# Patient Record
Sex: Male | Born: 2000 | Race: White | Hispanic: No | Marital: Single | Smoking: Never smoker
Health system: Southern US, Community
[De-identification: ages and names within clinical notes are randomized; demographics above are authoritative.]

## PROBLEM LIST (undated history)

## (undated) ENCOUNTER — Ambulatory Visit: Admission: EM | Payer: 59

## (undated) ENCOUNTER — Ambulatory Visit: Admission: EM | Payer: 59 | Source: Home / Self Care

## (undated) DIAGNOSIS — J45909 Unspecified asthma, uncomplicated: Secondary | ICD-10-CM

---
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
## 2020-05-06 ENCOUNTER — Other Ambulatory Visit: Payer: Self-pay

## 2020-05-06 ENCOUNTER — Ambulatory Visit
Admission: EM | Admit: 2020-05-06 | Discharge: 2020-05-06 | Disposition: A | Discharge status: Home / Self Care | Payer: Self-pay | Attending: Physician Assistant | Admitting: Physician Assistant

## 2020-05-06 DIAGNOSIS — J3489 Other specified disorders of nose and nasal sinuses: Secondary | ICD-10-CM

## 2020-05-06 DIAGNOSIS — R059 Cough, unspecified: Secondary | ICD-10-CM

## 2020-05-06 DIAGNOSIS — R0981 Nasal congestion: Secondary | ICD-10-CM

## 2020-05-06 MED ORDER — FLUTICASONE PROPIONATE 50 MCG/ACT NA SUSP
2.0000 | Freq: Every day | NASAL | 0 refills | Status: DC
Start: 2020-05-06 — End: 2022-11-28

## 2020-05-06 MED ORDER — ALBUTEROL SULFATE HFA 108 (90 BASE) MCG/ACT IN AERS: 1.0000 | INHALATION_SPRAY | Freq: Four times a day (QID) | RESPIRATORY_TRACT | 0 refills | Status: DC | PRN

## 2020-05-06 MED ORDER — AZELASTINE HCL 0.1 % NA SOLN
2.0000 | Freq: Two times a day (BID) | NASAL | 0 refills | Status: AC
Start: 2020-05-06 — End: ?

## 2020-05-06 NOTE — ED Triage Notes (Signed)
Pt c/o sore throat, runny nose, sneezing, and congestion since Monday.

## 2020-05-06 NOTE — Discharge Instructions (Addendum)
COVID PCR testing ordered. I would like you to quarantine until testing results. Flonase and azelastine as directed for nasal congestion/drainage. Albuterol as needed for cough/shortness of breath. Tylenol/motrin for pain and fever. Keep hydrated, urine should be clear to pale yellow in color. If experiencing shortness of breath, trouble breathing, go to the emergency department for further evaluation needed.

## 2020-05-06 NOTE — ED Provider Notes (Signed)
[facility]    CSN: [id number] Arrival date & time: 05/06/20  1208      History   Chief Complaint Chief Complaint  Patient presents with  . Sore Throat    HPI Mitchell Wall is a 19 y.o. male.   19 year old male comes in for 3 day of URI symptoms. Sore throat, rhinorrhea, nasal congestion, sneezing. Bilateral ear pain/fullness. No ear drainage. Mild cough. Denies fever, chills, body aches. Denies abdominal pain, nausea, vomiting, diarrhea. Intermittent shortness of breath, where he feels his lungs are heavy. Denies loss of taste/smell. Never smoker.      History reviewed. No pertinent past medical history.  There are no problems to display for this patient.   History reviewed. No pertinent surgical history.     Home Medications    Prior to Admission medications   Medication Sig Start Date End Date Taking? Authorizing Provider  albuterol (VENTOLIN HFA) 108 (90 Base) MCG/ACT inhaler Inhale 1-2 puffs into the lungs every 6 (six) hours as needed for wheezing or shortness of breath. 05/06/20   Tasia Catchings, Wonder Donaway V, PA-C  azelastine (ASTELIN) 0.1 % nasal spray Place 2 sprays into both nostrils 2 (two) times daily. 05/06/20   Tasia Catchings, Yalda Herd V, PA-C  fluticasone (FLONASE) 50 MCG/ACT nasal spray Place 2 sprays into both nostrils daily. 05/06/20   Tasia Catchings, Nazli Penn V, PA-C  Multiple Vitamins-Minerals (MULTIVITAMIN PO) Take 1 tablet by mouth daily.    [provider]    Family History History reviewed. No pertinent family history.  Social History Social History   Tobacco Use  . Smoking status: Never Smoker  . Smokeless tobacco: Never Used  Substance Use Topics  . Alcohol use: Never  . Drug use: Never     Allergies   Patient has no known allergies.   Review of Systems Review of Systems  Reason unable to perform ROS: See HPI as above.     Physical Exam Triage Vital Signs ED Triage Vitals  Enc Vitals Group     BP 05/06/20 1346 115/76     Pulse Rate 05/06/20 1346 70       Resp 05/06/20 1346 18     Temp 05/06/20 1346 98.3 F (36.8 C)     Temp Source 05/06/20 1346 Oral     SpO2 05/06/20 1346 98 %     Weight --      Height --      Head Circumference --      Peak Flow --      Pain Score 05/06/20 1354 0     Pain Loc --      Pain Edu? --      Excl. in [place]? --    No data found.  Updated Vital Signs BP 115/76 (BP Location: Right Arm)   Pulse 70   Temp 98.3 F (36.8 C) (Oral)   Resp 18   SpO2 98%   Physical Exam Constitutional:      General: He is not in acute distress.    Appearance: He is well-developed. He is not ill-appearing, toxic-appearing or diaphoretic.  HENT:     Head: Normocephalic and atraumatic.     Right Ear: Tympanic membrane, ear canal and external ear normal. Tympanic membrane is not erythematous or bulging.     Left Ear: Tympanic membrane, ear canal and external ear normal. Tympanic membrane is not erythematous or bulging.     Nose:     Right Sinus: No maxillary sinus tenderness or frontal sinus  tenderness.     Left Sinus: No maxillary sinus tenderness or frontal sinus tenderness.     Mouth/Throat:     Mouth: Mucous membranes are moist.     Pharynx: Oropharynx is clear. Uvula midline.     Tonsils: No tonsillar exudate.  Eyes:     Conjunctiva/sclera: Conjunctivae normal.     Pupils: Pupils are equal, round, and reactive to light.  Cardiovascular:     Rate and Rhythm: Normal rate and regular rhythm.  Pulmonary:     Effort: Pulmonary effort is normal. No accessory muscle usage, prolonged expiration, respiratory distress or retractions.     Breath sounds: No decreased air movement or transmitted upper airway sounds. No decreased breath sounds.     Comments: LCTAB Musculoskeletal:     Cervical back: Normal range of motion and neck supple.  Skin:    General: Skin is warm and dry.  Neurological:     Mental Status: He is alert and oriented to person, place, and time.      UC Treatments / Results  Labs (all labs ordered  are listed, but only abnormal results are displayed) Labs Reviewed  NOVEL CORONAVIRUS, NAA    EKG   Radiology No results found.  Procedures Procedures (including critical care time)  Medications Ordered in UC Medications - No data to display  Initial Impression / Assessment and Plan / UC Course  I have reviewed the triage vital signs and the nursing notes.  Pertinent labs & imaging results that were available during my care of the patient were reviewed by me and considered in my medical decision making (see chart for details).    COVID PCR test ordered. Patient to quarantine until testing results return. No alarming signs on exam.  Patient speaking in full sentences without respiratory distress.  Symptomatic treatment discussed.  Push fluids.  Return precautions given.  Patient expresses understanding and agrees to plan.  Final Clinical Impressions(s) / UC Diagnoses   Final diagnoses:  Rhinorrhea  Nasal congestion  Cough    ED Prescriptions    Medication Sig Dispense Auth. Provider   fluticasone (FLONASE) 50 MCG/ACT nasal spray Place 2 sprays into both nostrils daily. 1 g Torrey Ballinas V, PA-C   azelastine (ASTELIN) 0.1 % nasal spray Place 2 sprays into both nostrils 2 (two) times daily. 30 mL Chynah Orihuela V, PA-C   albuterol (VENTOLIN HFA) 108 (90 Base) MCG/ACT inhaler Inhale 1-2 puffs into the lungs every 6 (six) hours as needed for wheezing or shortness of breath. 8 g Belinda Fisher, PA-C     PDMP not reviewed this encounter.   Belinda Fisher, PA-C 05/06/20 1426

## 2020-05-07 LAB — SARS-COV-2, NAA 2 DAY TAT

## 2020-05-07 LAB — NOVEL CORONAVIRUS, NAA: SARS-CoV-2, NAA: NOT DETECTED

## 2022-01-26 ENCOUNTER — Other Ambulatory Visit: Payer: Self-pay

## 2022-01-26 ENCOUNTER — Ambulatory Visit: Admission: EM | Admit: 2022-01-26 | Discharge: 2022-01-26 | Discharge status: Against Medical Advice | Payer: Self-pay

## 2022-01-26 ENCOUNTER — Ambulatory Visit
Admission: EM | Admit: 2022-01-26 | Discharge: 2022-01-26 | Disposition: A | Discharge status: Home / Self Care | Payer: 59 | Attending: Physician Assistant | Admitting: Physician Assistant

## 2022-01-26 DIAGNOSIS — R112 Nausea with vomiting, unspecified: Secondary | ICD-10-CM

## 2022-01-26 DIAGNOSIS — J069 Acute upper respiratory infection, unspecified: Secondary | ICD-10-CM | POA: Diagnosis not present

## 2022-01-26 DIAGNOSIS — R040 Epistaxis: Secondary | ICD-10-CM

## 2022-01-26 LAB — POCT RAPID STREP A (OFFICE): Rapid Strep A Screen: NEGATIVE

## 2022-01-26 MED ORDER — ONDANSETRON 4 MG PO TBDP: 4.0000 mg | ORAL_TABLET | Freq: Three times a day (TID) | ORAL | 0 refills | Status: DC | PRN

## 2022-01-26 NOTE — ED Triage Notes (Signed)
Pt presents with c/o sore throat, cough, diarrhea, abdominal pain and vomiting x 1 month. ? ?Pt states the past few days have been worse.  ? ?

## 2022-01-26 NOTE — ED Provider Notes (Signed)
?[facility] ? ? ? ?CSN: [id number] ?Arrival date & time: 01/26/22  1155 ? ? ?  ? ?History   ?Chief Complaint ?Chief Complaint  ?Patient presents with  ? Cough  ? Sore Throat  ? Abdominal Pain  ? Emesis  ? Diarrhea  ? ? ?HPI ?Mitchell Wall is a 21 y.o. male.  ? ?Patient here today for evaluation of sore throat, cough, nausea, vomiting, diarrhea that is been ongoing for the last month.  He reports he did have some upper respiratory symptoms and nausea and vomiting just started over the weekend.  He states he has not had any vomiting over the last few days but still feels nauseous.  He does not report fever.  He does report some nosebleeds that have been ongoing for the last month as well.  He notes that nosebleeds will last 5 to 10 minutes at a time. ? ?The history is provided by the patient.  ? ?History reviewed. No pertinent past medical history. ? ?There are no problems to display for this patient. ? ? ?History reviewed. No pertinent surgical history. ? ? ? ? ?Home Medications   ? ?Prior to Admission medications   ?Medication Sig Start Date End Date Taking? Authorizing Provider  ?ondansetron (ZOFRAN-ODT) 4 MG disintegrating tablet Take 1 tablet (4 mg total) by mouth every 8 (eight) hours as needed. 01/26/22  Yes Tomi Bamberger, PA-C  ?albuterol (VENTOLIN HFA) 108 (90 Base) MCG/ACT inhaler Inhale 1-2 puffs into the lungs every 6 (six) hours as needed for wheezing or shortness of breath. 05/06/20   Cathie Hoops, Amy V, PA-C  ?azelastine (ASTELIN) 0.1 % nasal spray Place 2 sprays into both nostrils 2 (two) times daily. 05/06/20   Cathie Hoops, Amy V, PA-C  ?fluticasone (FLONASE) 50 MCG/ACT nasal spray Place 2 sprays into both nostrils daily. 05/06/20   Belinda Fisher, PA-C  ?Multiple Vitamins-Minerals (MULTIVITAMIN PO) Take 1 tablet by mouth daily.    [provider]  ? ? ?Family History ?History reviewed. No pertinent family history. ? ?Social History ?Social History  ? ?Tobacco Use  ? Smoking status: Never  ? Smokeless  tobacco: Never  ?Substance Use Topics  ? Alcohol use: Never  ? Drug use: Never  ? ? ? ?Allergies   ?Patient has no known allergies. ? ? ?Review of Systems ?Review of Systems  ?Constitutional:  Negative for chills and fever.  ?HENT:  Positive for congestion and sore throat. Negative for ear pain.   ?Eyes:  Negative for discharge and redness.  ?Respiratory:  Positive for cough. Negative for shortness of breath.   ?Gastrointestinal:  Positive for abdominal pain, diarrhea, nausea and vomiting.  ? ? ?Physical Exam ?Triage Vital Signs ?ED Triage Vitals  ?Enc Vitals Group  ?   BP   ?   Pulse   ?   Resp   ?   Temp   ?   Temp src   ?   SpO2   ?   Weight   ?   Height   ?   Head Circumference   ?   Peak Flow   ?   Pain Score   ?   Pain Loc   ?   Pain Edu?   ?   Excl. in [place]?   ? ?No data found. ? ?Updated Vital Signs ?BP 126/80 (BP Location: Left Arm)   Pulse 78   Temp 98.2 ?F (36.8 ?C) (Oral)   Resp 18   SpO2 96%  ? ?  ? ?  Physical Exam ?Vitals and nursing note reviewed.  ?Constitutional:   ?   General: He is not in acute distress. ?   Appearance: Normal appearance. He is not ill-appearing.  ?HENT:  ?   Head: Normocephalic and atraumatic.  ?   Nose: Nose normal. No congestion.  ?   Mouth/Throat:  ?   Mouth: Mucous membranes are moist.  ?   Pharynx: Oropharynx is clear. No oropharyngeal exudate or posterior oropharyngeal erythema.  ?Eyes:  ?   Conjunctiva/sclera: Conjunctivae normal.  ?Cardiovascular:  ?   Rate and Rhythm: Normal rate and regular rhythm.  ?   Heart sounds: Normal heart sounds. No murmur heard. ?Pulmonary:  ?   Effort: Pulmonary effort is normal. No respiratory distress.  ?   Breath sounds: Normal breath sounds. No wheezing, rhonchi or rales.  ?Skin: ?   General: Skin is warm and dry.  ?Neurological:  ?   Mental Status: He is alert.  ?Psychiatric:     ?   Mood and Affect: Mood normal.     ?   Thought Content: Thought content normal.  ? ? ? ?UC Treatments / Results  ?Labs ?(all labs ordered are listed, but  only abnormal results are displayed) ?Labs Reviewed  ?CBC WITH DIFFERENTIAL/PLATELET  ?COMPREHENSIVE METABOLIC PANEL  ?POCT RAPID STREP A (OFFICE)  ? ? ?EKG ? ? ?Radiology ?No results found. ? ?Procedures ?Procedures (including critical care time) ? ?Medications Ordered in UC ?Medications - No data to display ? ?Initial Impression / Assessment and Plan / UC Course  ?I have reviewed the triage vital signs and the nursing notes. ? ?Pertinent labs & imaging results that were available during my care of the patient were reviewed by me and considered in my medical decision making (see chart for details). ? ?  ?Patient requesting labs because he is concerned about his immune system.  CBC and CMP ordered.  Discussed most likely viral etiology of symptoms and will treat with Zofran to help with nausea.  Encouraged increase fluids.  Recommend follow-up with PCP as soon as possible.  Encouraged sooner follow-up with any further concerns. ? ?Final Clinical Impressions(s) / UC Diagnoses  ? ?Final diagnoses:  ?Nausea and vomiting, unspecified vomiting type  ?Acute upper respiratory infection  ?Frequent nosebleeds  ? ? ? ?Discharge Instructions   ? ?  ? ?Please establish care with PCP as soon as possible.  ? ? ? ? ? ?ED Prescriptions   ? ? Medication Sig Dispense Auth. Provider  ? ondansetron (ZOFRAN-ODT) 4 MG disintegrating tablet Take 1 tablet (4 mg total) by mouth every 8 (eight) hours as needed. 20 tablet Tomi Bamberger, PA-C  ? ?  ? ?PDMP not reviewed this encounter. ?  ?Tomi Bamberger, PA-C ?01/26/22 1242 ? ?

## 2022-01-26 NOTE — Discharge Instructions (Signed)
?  Please establish care with PCP as soon as possible.  ? ?

## 2022-01-26 NOTE — ED Notes (Signed)
Pt reports concerns of bad breath the past few days.  ? ?

## 2022-01-28 LAB — COMPREHENSIVE METABOLIC PANEL
ALT: 27 IU/L (ref 0–44)
AST: 25 IU/L (ref 0–40)
Albumin/Globulin Ratio: 1.9 (ref 1.2–2.2)
Albumin: 5 g/dL (ref 4.1–5.2)
Alkaline Phosphatase: 135 IU/L — ABNORMAL HIGH (ref 51–125)
BUN/Creatinine Ratio: 11 (ref 9–20)
BUN: 10 mg/dL (ref 6–20)
Bilirubin Total: 1 mg/dL (ref 0.0–1.2)
CO2: 20 mmol/L (ref 20–29)
Calcium: 10.2 mg/dL (ref 8.7–10.2)
Chloride: 103 mmol/L (ref 96–106)
Creatinine, Ser: 0.88 mg/dL (ref 0.76–1.27)
Globulin, Total: 2.6 g/dL (ref 1.5–4.5)
Glucose: 78 mg/dL (ref 70–99)
Potassium: 4.4 mmol/L (ref 3.5–5.2)
Sodium: 142 mmol/L (ref 134–144)
Total Protein: 7.6 g/dL (ref 6.0–8.5)
eGFR: 126 mL/min/{1.73_m2} (ref >59)

## 2022-01-28 LAB — CBC WITH DIFFERENTIAL/PLATELET
Basophils Absolute: 0 10*3/uL (ref 0.0–0.2)
Basos: 0 %
EOS (ABSOLUTE): 0.2 10*3/uL (ref 0.0–0.4)
Eos: 2 %
Hematocrit: 38 % (ref 37.5–51.0)
Hemoglobin: 13 g/dL (ref 13.0–17.7)
Immature Grans (Abs): 0 10*3/uL (ref 0.0–0.1)
Immature Granulocytes: 0 %
Lymphocytes Absolute: 1.7 10*3/uL (ref 0.7–3.1)
Lymphs: 17 %
MCH: 30.2 pg (ref 26.6–33.0)
MCHC: 34.2 g/dL (ref 31.5–35.7)
MCV: 88 fL (ref 79–97)
Monocytes Absolute: 0.8 10*3/uL (ref 0.1–0.9)
Monocytes: 8 %
Neutrophils Absolute: 7.4 10*3/uL — ABNORMAL HIGH (ref 1.4–7.0)
Neutrophils: 73 %
Platelets: 415 10*3/uL (ref 150–450)
RBC: 4.31 x10E6/uL (ref 4.14–5.80)
RDW: 12.4 % (ref 11.6–15.4)
WBC: 10.1 10*3/uL (ref 3.4–10.8)

## 2022-01-31 ENCOUNTER — Ambulatory Visit (INDEPENDENT_AMBULATORY_CARE_PROVIDER_SITE_OTHER): Payer: 59

## 2022-01-31 ENCOUNTER — Ambulatory Visit
Admission: EM | Admit: 2022-01-31 | Discharge: 2022-01-31 | Disposition: A | Discharge status: Home / Self Care | Payer: 59 | Attending: Physician Assistant | Admitting: Physician Assistant

## 2022-01-31 ENCOUNTER — Other Ambulatory Visit: Payer: Self-pay

## 2022-01-31 DIAGNOSIS — J069 Acute upper respiratory infection, unspecified: Secondary | ICD-10-CM

## 2022-01-31 DIAGNOSIS — R0989 Other specified symptoms and signs involving the circulatory and respiratory systems: Secondary | ICD-10-CM

## 2022-01-31 DIAGNOSIS — J029 Acute pharyngitis, unspecified: Secondary | ICD-10-CM

## 2022-01-31 DIAGNOSIS — R059 Cough, unspecified: Secondary | ICD-10-CM

## 2022-01-31 NOTE — ED Triage Notes (Signed)
Pt c/o cough, sore throat, nasal drainage, headache, diarrhea, states "my lungs feel heavy"  ? ?Denies ear aches, body aches or chills ? ?Onset ~ 1 month ago states last week dx w/ norovirus but concerned sxs have been last longer than expected.  ?

## 2022-01-31 NOTE — Discharge Instructions (Signed)
?  Please make follow up with PCP.  ?

## 2022-01-31 NOTE — ED Provider Notes (Signed)
?[facility] ? ? ? ?CSN: [id number] ?Arrival date & time: 01/31/22  9179 ? ? ?  ? ?History   ?Chief Complaint ?Chief Complaint  ?Patient presents with  ? Sore Throat  ? ? ?HPI ?Mitchell Wall is a 21 y.o. male.  ? ?Patient here today for evaluation of continued nasal congestion and cough after having norovirus and upper respiratory infection recently. He reports his symptoms have been ongoing for the last month and now lungs feel "heavy". He has not had any continued vomiting. He has not had recent fever. He does not report treatment for symptoms.  ? ?The history is provided by the patient.  ?Sore Throat ?Pertinent negatives include no abdominal pain and no shortness of breath.  ? ?History reviewed. No pertinent past medical history. ? ?There are no problems to display for this patient. ? ? ?History reviewed. No pertinent surgical history. ? ? ? ? ?Home Medications   ? ?Prior to Admission medications   ?Medication Sig Start Date End Date Taking? Authorizing Provider  ?albuterol (VENTOLIN HFA) 108 (90 Base) MCG/ACT inhaler Inhale 1-2 puffs into the lungs every 6 (six) hours as needed for wheezing or shortness of breath. 05/06/20   Cathie Hoops, Amy V, PA-C  ?azelastine (ASTELIN) 0.1 % nasal spray Place 2 sprays into both nostrils 2 (two) times daily. 05/06/20   Cathie Hoops, Amy V, PA-C  ?fluticasone (FLONASE) 50 MCG/ACT nasal spray Place 2 sprays into both nostrils daily. 05/06/20   Belinda Fisher, PA-C  ?Multiple Vitamins-Minerals (MULTIVITAMIN PO) Take 1 tablet by mouth daily.    [provider]  ?ondansetron (ZOFRAN-ODT) 4 MG disintegrating tablet Take 1 tablet (4 mg total) by mouth every 8 (eight) hours as needed. 01/26/22   Tomi Bamberger, PA-C  ? ? ?Family History ?History reviewed. No pertinent family history. ? ?Social History ?Social History  ? ?Tobacco Use  ? Smoking status: Never  ? Smokeless tobacco: Never  ?Substance Use Topics  ? Alcohol use: Never  ? Drug use: Never  ? ? ? ?Allergies   ?Patient has no known  allergies. ? ? ?Review of Systems ?Review of Systems  ?Constitutional:  Negative for chills and fever.  ?HENT:  Positive for congestion and sore throat. Negative for ear pain.   ?Eyes:  Negative for discharge and redness.  ?Respiratory:  Positive for cough. Negative for shortness of breath.   ?Gastrointestinal:  Negative for abdominal pain, nausea and vomiting.  ? ? ?Physical Exam ?Triage Vital Signs ?ED Triage Vitals  ?Enc Vitals Group  ?   BP 01/31/22 1120 (!) 118/52  ?   Pulse Rate 01/31/22 1120 86  ?   Resp 01/31/22 1120 18  ?   Temp 01/31/22 1120 98.3 ?F (36.8 ?C)  ?   Temp Source 01/31/22 1120 Oral  ?   SpO2 01/31/22 1120 95 %  ?   Weight --   ?   Height --   ?   Head Circumference --   ?   Peak Flow --   ?   Pain Score 01/31/22 1121 0  ?   Pain Loc --   ?   Pain Edu? --   ?   Excl. in GC? --   ? ?No data found. ? ?Updated Vital Signs ?BP (!) 118/52 (BP Location: Left Arm)   Pulse 86   Temp 98.3 ?F (36.8 ?C) (Oral)   Resp 18   SpO2 95%  ?   ? ?Physical Exam ?Vitals and nursing note reviewed.  ?  Constitutional:   ?   General: He is not in acute distress. ?   Appearance: Normal appearance. He is not ill-appearing.  ?HENT:  ?   Head: Normocephalic and atraumatic.  ?Eyes:  ?   Conjunctiva/sclera: Conjunctivae normal.  ?Cardiovascular:  ?   Rate and Rhythm: Normal rate.  ?Pulmonary:  ?   Effort: Pulmonary effort is normal.  ?Neurological:  ?   Mental Status: He is alert.  ?Psychiatric:     ?   Mood and Affect: Mood normal.     ?   Behavior: Behavior normal.     ?   Thought Content: Thought content normal.  ? ? ? ?UC Treatments / Results  ?Labs ?(all labs ordered are listed, but only abnormal results are displayed) ?Labs Reviewed - No data to display ? ?EKG ? ? ?Radiology ?DG Chest 2 View ? ?Result Date: 01/31/2022 ?CLINICAL DATA:  Cough, sore throat, nasal drainage, headache, diarrhea, heavy feeling in lungs EXAM: CHEST - 2 VIEW COMPARISON:  09/27/2004 FINDINGS: Normal heart size, mediastinal contours, and  pulmonary vascularity. Lungs clear. No pleural effusion or pneumothorax. Bones unremarkable. IMPRESSION: Normal exam. Electronically Signed   By: Ulyses Southward M.D.   On: 01/31/2022 11:33   ? ?Procedures ?Procedures (including critical care time) ? ?Medications Ordered in UC ?Medications - No data to display ? ?Initial Impression / Assessment and Plan / UC Course  ?I have reviewed the triage vital signs and the nursing notes. ? ?Pertinent labs & imaging results that were available during my care of the patient were reviewed by me and considered in my medical decision making (see chart for details). ? ?  ?Discussed that CXR appeared clear. Labs were not concerning. Patient still concerned with mild lingering symptoms. Discussed that he likely has had repeat viral illness. Offered repeat labs but patient declined. Recommended continued symptomatic treatment and rest. Encouraged follow up with any further concerns.  ? ?Final Clinical Impressions(s) / UC Diagnoses  ? ?Final diagnoses:  ?Acute upper respiratory infection  ? ? ? ?Discharge Instructions   ? ?  ? ?Please make follow up with PCP.  ? ? ? ? ?ED Prescriptions   ?None ?  ? ?PDMP not reviewed this encounter. ?  ?Tomi Bamberger, PA-C ?01/31/22 1243 ? ?

## 2022-11-28 ENCOUNTER — Ambulatory Visit
Admission: EM | Admit: 2022-11-28 | Discharge: 2022-11-28 | Disposition: A | Discharge status: Home / Self Care | Payer: 59 | Attending: Internal Medicine | Admitting: Internal Medicine

## 2022-11-28 DIAGNOSIS — B349 Viral infection, unspecified: Secondary | ICD-10-CM

## 2022-11-28 DIAGNOSIS — R112 Nausea with vomiting, unspecified: Secondary | ICD-10-CM

## 2022-11-28 DIAGNOSIS — R197 Diarrhea, unspecified: Secondary | ICD-10-CM | POA: Diagnosis not present

## 2022-11-28 HISTORY — DX: Unspecified asthma, uncomplicated: J45.909

## 2022-11-28 MED ORDER — ONDANSETRON 4 MG PO TBDP: 4.0000 mg | ORAL_TABLET | Freq: Three times a day (TID) | ORAL | 0 refills | Status: DC | PRN

## 2022-11-28 MED ORDER — CETIRIZINE HCL 10 MG PO TABS: 10.0000 mg | ORAL_TABLET | Freq: Every day | ORAL | 0 refills | Status: DC

## 2022-11-28 MED ORDER — FLUTICASONE PROPIONATE 50 MCG/ACT NA SUSP: 1.0000 | Freq: Every day | NASAL | 0 refills | Status: DC

## 2022-11-28 MED ORDER — BENZONATATE 100 MG PO CAPS: 100.0000 mg | ORAL_CAPSULE | Freq: Three times a day (TID) | ORAL | 0 refills | Status: DC | PRN

## 2022-11-28 NOTE — ED Provider Notes (Signed)
[facility]    CSN: [id number] Arrival date & time: 11/28/22  1723      History   Chief Complaint Chief Complaint  Patient presents with   Nausea    Severe cough, fever, nausea, headache, fatigue - Entered by patient    HPI Mitchell Wall is a 22 y.o. male.   Patient presents with nausea, vomiting, diarrhea, cough, nasal congestion, drainage, fever that started about 5 to 6 days ago.  Tmax at home was 103 when symptoms first started.  He reports several family members have had similar symptoms.  Denies chest pain, shortness of breath, sore throat, ear pain, abdominal pain.  Reports he has been able to tolerate fluids but has had decreased appetite.  Reports history of asthma but has not had to use inhaler for symptoms.  Reports some intermittent dizziness and associated ear pressure.  Denies blood in stool or emesis.     Past Medical History:  Diagnosis Date   Asthma     There are no problems to display for this patient.   History reviewed. No pertinent surgical history.     Home Medications    Prior to Admission medications   Medication Sig Start Date End Date Taking? Authorizing Provider  benzonatate (TESSALON) 100 MG capsule Take 1 capsule (100 mg total) by mouth every 8 (eight) hours as needed for cough. 11/28/22  Yes Mena Simonis, Acie Fredrickson, FNP  cetirizine (ZYRTEC) 10 MG tablet Take 1 tablet (10 mg total) by mouth daily. 11/28/22  Yes Tejuan Gholson, Rolly Salter E, FNP  fluticasone (FLONASE) 50 MCG/ACT nasal spray Place 1 spray into both nostrils daily. 11/28/22  Yes Kelson Queenan, Rolly Salter E, FNP  ondansetron (ZOFRAN-ODT) 4 MG disintegrating tablet Take 1 tablet (4 mg total) by mouth every 8 (eight) hours as needed for nausea or vomiting. 11/28/22  Yes Calyb Mcquarrie, Acie Fredrickson, FNP  albuterol (VENTOLIN HFA) 108 (90 Base) MCG/ACT inhaler Inhale 1-2 puffs into the lungs every 6 (six) hours as needed for wheezing or shortness of breath. 05/06/20   Cathie Hoops, Amy V, PA-C  azelastine (ASTELIN) 0.1 % nasal spray Place  2 sprays into both nostrils 2 (two) times daily. 05/06/20   Cathie Hoops, Amy V, PA-C  Multiple Vitamins-Minerals (MULTIVITAMIN PO) Take 1 tablet by mouth daily.    [provider]    Family History History reviewed. No pertinent family history.  Social History Social History   Tobacco Use   Smoking status: Never   Smokeless tobacco: Never  Substance Use Topics   Alcohol use: Never   Drug use: Never     Allergies   Patient has no known allergies.   Review of Systems Review of Systems Per HPI  Physical Exam Triage Vital Signs ED Triage Vitals [11/28/22 1905]  Enc Vitals Group     BP      Pulse Rate 82     Resp 16     Temp 98.3 F (36.8 C)     Temp Source Oral     SpO2 99 %     Weight      Height      Head Circumference      Peak Flow      Pain Score 2     Pain Loc      Pain Edu?      Excl. in GC?    No data found.  Updated Vital Signs Pulse 82   Temp 98.3 F (36.8 C) (Oral)   Resp 16   SpO2 99%  Visual Acuity Right Eye Distance:   Left Eye Distance:   Bilateral Distance:    Right Eye Near:   Left Eye Near:    Bilateral Near:     Physical Exam Constitutional:      General: He is not in acute distress.    Appearance: Normal appearance. He is not toxic-appearing or diaphoretic.  HENT:     Head: Normocephalic and atraumatic.     Right Ear: Ear canal normal. No drainage, swelling or tenderness. A middle ear effusion is present. Tympanic membrane is not perforated, erythematous or bulging.     Left Ear: Ear canal normal. No drainage, swelling or tenderness. A middle ear effusion is present. Tympanic membrane is not perforated, erythematous or bulging.     Nose: Congestion present.     Mouth/Throat:     Mouth: Mucous membranes are moist.     Pharynx: No posterior oropharyngeal erythema.  Eyes:     Extraocular Movements: Extraocular movements intact.     Conjunctiva/sclera: Conjunctivae normal.     Pupils: Pupils are equal, round, and reactive to  light.  Cardiovascular:     Rate and Rhythm: Normal rate and regular rhythm.     Pulses: Normal pulses.     Heart sounds: Normal heart sounds.  Pulmonary:     Effort: Pulmonary effort is normal. No respiratory distress.     Breath sounds: Normal breath sounds. No stridor. No wheezing, rhonchi or rales.  Abdominal:     General: Abdomen is flat. Bowel sounds are normal. There is no distension.     Palpations: Abdomen is soft.     Tenderness: There is no abdominal tenderness.  Musculoskeletal:        General: Normal range of motion.     Cervical back: Normal range of motion.  Skin:    General: Skin is warm and dry.  Neurological:     General: No focal deficit present.     Mental Status: He is alert and oriented to person, place, and time. Mental status is at baseline.  Psychiatric:        Mood and Affect: Mood normal.        Behavior: Behavior normal.      UC Treatments / Results  Labs (all labs ordered are listed, but only abnormal results are displayed) Labs Reviewed - No data to display  EKG   Radiology No results found.  Procedures Procedures (including critical care time)  Medications Ordered in UC Medications - No data to display  Initial Impression / Assessment and Plan / UC Course  I have reviewed the triage vital signs and the nursing notes.  Pertinent labs & imaging results that were available during my care of the patient were reviewed by me and considered in my medical decision making (see chart for details).     Symptoms appear viral in etiology.  Patient is able to tolerate fluids which is reassuring.  There are no signs of acute abdomen or dehydration on exam so do not think that emergent evaluation is necessary.  Suspect dizziness is due to fluid behind eardrums.  Will treat this with antihistamine and Flonase.  Cough medication prescribed for patient.  Ondansetron prescribed to take as needed nausea.  Advised to ensure clear oral fluid intake as well  as bland diet.  Patient declined COVID testing.  Discussed strict return and ER precautions.  Patient verbalized understanding and was agreeable with plan. Final Clinical Impressions(s) / UC Diagnoses   Final diagnoses:  Viral illness  Nausea vomiting and diarrhea     Discharge Instructions      You have a viral illness that should run its course and self resolve with symptomatic treatment as we discussed.  I have prescribed 4 different medications to help alleviate symptoms.  Please ensure adequate fluid hydration with clear oral fluids including Gatorade, water, Pedialyte.  Ensure a bland diet.  See attached instructions.  Please follow-up if symptoms persist or worsen.    ED Prescriptions     Medication Sig Dispense Auth. Provider   ondansetron (ZOFRAN-ODT) 4 MG disintegrating tablet Take 1 tablet (4 mg total) by mouth every 8 (eight) hours as needed for nausea or vomiting. 20 tablet [place], [place] E, [place]   cetirizine (ZYRTEC) 10 MG tablet Take 1 tablet (10 mg total) by mouth daily. 30 tablet [place], [place] E, [place]   fluticasone [facility]) 50 MCG/ACT nasal spray Place 1 spray into both nostrils daily. 16 g Lamonica Trueba, Hildred Alamin E, [place]   benzonatate (TESSALON) 100 MG capsule Take 1 capsule (100 mg total) by mouth every 8 (eight) hours as needed for cough. 21 capsule [place], Michele Rockers, [place]      PDMP not reviewed this encounter.   Teodora Medici,  11/28/22 1945

## 2022-11-28 NOTE — Discharge Instructions (Signed)
You have a viral illness that should run its course and self resolve with symptomatic treatment as we discussed.  I have prescribed 4 different medications to help alleviate symptoms.  Please ensure adequate fluid hydration with clear oral fluids including Gatorade, water, Pedialyte.  Ensure a bland diet.  See attached instructions.  Please follow-up if symptoms persist or worsen.

## 2022-11-28 NOTE — ED Triage Notes (Signed)
Pt c/o nausea, vomiting, nasal drainage, cough, fever at home, headaches, dizziness,   Onset ~ Wednesday

## 2023-01-16 ENCOUNTER — Ambulatory Visit
Admission: EM | Admit: 2023-01-16 | Discharge: 2023-01-16 | Disposition: A | Discharge status: Home / Self Care | Payer: 59 | Attending: Nurse Practitioner | Admitting: Nurse Practitioner

## 2023-01-16 DIAGNOSIS — G44209 Tension-type headache, unspecified, not intractable: Secondary | ICD-10-CM | POA: Diagnosis not present

## 2023-01-16 NOTE — ED Provider Notes (Signed)
[facility]    CSN: [id number] Arrival date & time: 01/16/23  1046      History   Chief Complaint Chief Complaint  Patient presents with   Headache    HPI Mitchell Wall is a 22 y.o. male.   HPI  He is in today recurrent headache that he rates a 8/10 when it occurs. He had a HA last night while working. 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 He has noted some dizziness with specific movements. He has noticed some  visual changes that have been progressing. He does not have a primary care provider. Denies shortness of breath, dyspnea on exertion, chest pain, nausea, vomiting or any edema.    Past Medical History:  Diagnosis Date   Asthma     There are no problems to display for this patient.   History reviewed. No pertinent surgical history.     Home Medications    Prior to Admission medications   Medication Sig Start Date End Date Taking? Authorizing Provider  albuterol (VENTOLIN HFA) 108 (90 Base) MCG/ACT inhaler Inhale 1-2 puffs into the lungs every 6 (six) hours as needed for wheezing or shortness of breath. 05/06/20   Tasia Catchings, Amy V, PA-C  azelastine (ASTELIN) 0.1 % nasal spray Place 2 sprays into both nostrils 2 (two) times daily. 05/06/20   Tasia Catchings, Amy V, PA-C  benzonatate (TESSALON) 100 MG capsule Take 1 capsule (100 mg total) by mouth every 8 (eight) hours as needed for cough. 11/28/22   Teodora Medici, FNP  cetirizine (ZYRTEC) 10 MG tablet Take 1 tablet (10 mg total) by mouth daily. 11/28/22   Teodora Medici, FNP  fluticasone (FLONASE) 50 MCG/ACT nasal spray Place 1 spray into both nostrils daily. 11/28/22   Teodora Medici, FNP  Multiple Vitamins-Minerals (MULTIVITAMIN PO) Take 1 tablet by mouth daily.    [provider]  ondansetron (ZOFRAN-ODT) 4 MG disintegrating tablet Take 1 tablet (4 mg total) by mouth every 8 (eight) hours as needed for nausea or vomiting. 11/28/22   Teodora Medici, FNP    Family History History  reviewed. No pertinent family history.  Social History Social History   Tobacco Use   Smoking status: Never   Smokeless tobacco: Never  Substance Use Topics   Alcohol use: Never   Drug use: Never     Allergies   Patient has no known allergies.   Review of Systems Review of Systems   Physical Exam Triage Vital Signs ED Triage Vitals  Enc Vitals Group     BP 01/16/23 1206 132/81     Pulse Rate 01/16/23 1206 76     Resp 01/16/23 1206 17     Temp 01/16/23 1206 97.9 F (36.6 C)     Temp Source 01/16/23 1206 Oral     SpO2 01/16/23 1206 98 %     Weight --      Height --      Head Circumference --      Peak Flow --      Pain Score 01/16/23 1205 6     Pain Loc --      Pain Edu? --      Excl. in [place]? --    No data found.  Updated Vital Signs BP 132/81 (BP Location: Left Arm)   Pulse 76   Temp 97.9 F (36.6 C) (Oral)   Resp 17   SpO2 98%   Visual Acuity Right Eye Distance:  Left Eye Distance:   Bilateral Distance:    Right Eye Near:   Left Eye Near:    Bilateral Near:     Physical Exam Constitutional:      Appearance: He is obese.  HENT:     Head: Normocephalic and atraumatic.     Mouth/Throat:     Mouth: Mucous membranes are moist.  Eyes:     Extraocular Movements: Extraocular movements intact.     Pupils: Pupils are equal, round, and reactive to light.  Cardiovascular:     Rate and Rhythm: Normal rate and regular rhythm.  Pulmonary:     Effort: Pulmonary effort is normal.     Comments: Diminished  Musculoskeletal:        General: Normal range of motion.     Cervical back: Normal range of motion and neck supple.  Skin:    General: Skin is warm and dry.     Capillary Refill: Capillary refill takes less than 2 seconds.  Neurological:     Mental Status: He is alert and oriented to person, place, and time.     Cranial Nerves: No cranial nerve deficit.     Sensory: No sensory deficit.     Motor: No weakness.     Gait: Gait normal.   Psychiatric:        Mood and Affect: Mood normal.        Behavior: Behavior normal.      UC Treatments / Results  Labs (all labs ordered are listed, but only abnormal results are displayed) Labs Reviewed - No data to display  EKG   Radiology No results found.  Procedures Procedures (including critical care time)  Medications Ordered in UC Medications - No data to display  Initial Impression / Assessment and Plan / UC Course  I have reviewed the triage vital signs and the nursing notes.  Pertinent labs & imaging results that were available during my care of the patient were reviewed by me and considered in my medical decision making (see chart for details).     Headache Final Clinical Impressions(s) / UC Diagnoses   Final diagnoses:  Tension-type headache, not intractable, unspecified chronicity pattern     Discharge Instructions      You have described a tension headache. Your neuro exam is normal. However you are not currently experiencing a headache.  As discussed when you have severe headaches the recommendation is to the ER for evaluation at the time.  You can trial Excedrin Tension Headache OTC as directed for headache. Again avoid the use of Afrin nasal sprays. Continue to hydrate well with water.      ED Prescriptions   None    PDMP not reviewed this encounter.   Dionisio David [place], [place] 01/16/23 1246

## 2023-01-16 NOTE — ED Triage Notes (Signed)
Pt presents with recurrent headache over past couple of days.

## 2023-01-16 NOTE — Discharge Instructions (Addendum)
You have described a tension headache. Your neuro exam is normal. However you are not currently experiencing a headache.  As discussed when you have severe headaches the recommendation is to the ER for evaluation at the time.  You can trial Excedrin Tension Headache OTC as directed for headache. Again avoid the use of Afrin nasal sprays. Continue to hydrate well with water.

## 2023-02-27 ENCOUNTER — Encounter (HOSPITAL_COMMUNITY): Payer: Self-pay

## 2023-02-27 ENCOUNTER — Other Ambulatory Visit: Payer: Self-pay

## 2023-02-27 ENCOUNTER — Emergency Department (HOSPITAL_COMMUNITY)
Admission: EM | Admit: 2023-02-27 | Discharge: 2023-02-27 | Disposition: A | Discharge status: Home / Self Care | Payer: 59 | Attending: Emergency Medicine | Admitting: Emergency Medicine

## 2023-02-27 ENCOUNTER — Ambulatory Visit
Admission: EM | Admit: 2023-02-27 | Discharge: 2023-02-27 | Disposition: A | Discharge status: Home / Self Care | Payer: 59 | Attending: Internal Medicine | Admitting: Internal Medicine

## 2023-02-27 ENCOUNTER — Ambulatory Visit (INDEPENDENT_AMBULATORY_CARE_PROVIDER_SITE_OTHER): Payer: 59

## 2023-02-27 DIAGNOSIS — J45901 Unspecified asthma with (acute) exacerbation: Secondary | ICD-10-CM | POA: Diagnosis not present

## 2023-02-27 DIAGNOSIS — J4541 Moderate persistent asthma with (acute) exacerbation: Secondary | ICD-10-CM | POA: Diagnosis not present

## 2023-02-27 DIAGNOSIS — R062 Wheezing: Secondary | ICD-10-CM | POA: Diagnosis present

## 2023-02-27 DIAGNOSIS — R0602 Shortness of breath: Secondary | ICD-10-CM | POA: Diagnosis not present

## 2023-02-27 DIAGNOSIS — R7981 Abnormal blood-gas level: Secondary | ICD-10-CM | POA: Diagnosis not present

## 2023-02-27 DIAGNOSIS — Z20822 Contact with and (suspected) exposure to covid-19: Secondary | ICD-10-CM | POA: Insufficient documentation

## 2023-02-27 DIAGNOSIS — Z7951 Long term (current) use of inhaled steroids: Secondary | ICD-10-CM | POA: Insufficient documentation

## 2023-02-27 LAB — RESP PANEL BY RT-PCR (RSV, FLU A&B, COVID)  RVPGX2
Influenza A by PCR: NEGATIVE
Influenza B by PCR: NEGATIVE
Resp Syncytial Virus by PCR: NEGATIVE
SARS Coronavirus 2 by RT PCR: NEGATIVE

## 2023-02-27 MED ORDER — PREDNISONE 10 MG PO TABS: 20.0000 mg | ORAL_TABLET | Freq: Every day | ORAL | 0 refills | Status: DC

## 2023-02-27 MED ORDER — ALBUTEROL SULFATE HFA 108 (90 BASE) MCG/ACT IN AERS: 1.0000 | INHALATION_SPRAY | Freq: Four times a day (QID) | RESPIRATORY_TRACT | 0 refills | Status: DC | PRN

## 2023-02-27 MED ORDER — DEXAMETHASONE SODIUM PHOSPHATE 10 MG/ML IJ SOLN
10.0000 mg | Freq: Once | INTRAMUSCULAR | Status: AC
  Administered 2023-02-27: 10 mg via INTRAMUSCULAR

## 2023-02-27 MED ORDER — ALBUTEROL SULFATE (2.5 MG/3ML) 0.083% IN NEBU
2.5000 mg | INHALATION_SOLUTION | Freq: Once | RESPIRATORY_TRACT | Status: AC
  Administered 2023-02-27: 2.5 mg via RESPIRATORY_TRACT

## 2023-02-27 MED ORDER — IPRATROPIUM-ALBUTEROL 0.5-2.5 (3) MG/3ML IN SOLN
3.0000 mL | Freq: Once | RESPIRATORY_TRACT | Status: AC
  Administered 2023-02-27: 3 mL via RESPIRATORY_TRACT

## 2023-02-27 NOTE — Discharge Instructions (Signed)
Please go to the nearest emergency department ([facility]) for evaluation of your asthma further as I am concerned about your oxygen level (it is low).

## 2023-02-27 NOTE — ED Triage Notes (Signed)
Pt c/o wheezing, dyspnea, chest feels heavy, feels pulling on chest with deep inhalation, taste blood when coughing, blood in poop (a small amount a couple of times)   Onset ~ 2 days ago   Hx of asthma, has not used inhaler in years

## 2023-02-27 NOTE — ED Notes (Signed)
Patient is being discharged from the Urgent Care and sent to the Emergency Department via self . Per Marcelino Duster, patient is in need of higher level of care due to SOB. Patient is aware and verbalizes understanding of plan of care.  Vitals:   02/27/23 1801 02/27/23 1912  BP: 133/85   Pulse: (!) 110   Resp: (!) 24   Temp: 99.3 F (37.4 C)   SpO2: (!) 89% (!) 89%

## 2023-02-27 NOTE — ED Provider Triage Note (Signed)
Emergency Medicine Provider Triage Evaluation Note  Mitchell Wall , a 22 y.o. male  was evaluated in triage.  From urgent care due to low oxygen.  Has a history of childhood asthma but has not been to the doctor since then.  No recent travel, surgery, history of DVT, leg swelling or tobacco use.  Says that he has been wheezing for the past day and a half.  Originally had flulike symptoms.  Review of Systems  Positive:  Negative:   Physical Exam  BP (!) 141/92   Pulse (!) 110   Temp 99 F (37.2 C) (Oral)   Resp (!) 21   Ht 5\' 9"  (1.753 m)   Wt 102.1 kg   SpO2 95%   BMI 33.23 kg/m  Gen:   Awake, no distress   Resp:  Normal effort  MSK:   Moves extremities without difficulty  Other:  Mildly tachycardic, likely secondary to steroids given in urgent care.  Wheezing scattered throughout  Medical Decision Making  Medically screening exam initiated at 8:35 PM.  Appropriate orders placed.  Milus Rahlf was informed that the remainder of the evaluation will be completed by another provider, this initial triage assessment does not replace that evaluation, and the importance of remaining in the ED until their evaluation is complete.     Saddie Benders, [place] 02/27/23 2036

## 2023-02-27 NOTE — ED Triage Notes (Signed)
Patient reports asthma flare up with wheezing this AM. Mitchell Wall to urgent care this evening and received steroids and breathing tx. They sent him here due to low oxygen levels, patient unsure what levels were at [name]. Patient 95% on RA at this time in triage.

## 2023-02-27 NOTE — Discharge Instructions (Addendum)
Return for any problem.  ?

## 2023-02-27 NOTE — ED Provider Notes (Signed)
[facility]    CSN: [id number] Arrival date & time: 02/27/23  1755      History   Chief Complaint Chief Complaint  Patient presents with   Shortness of Breath    HPI Mitchell Wall is a 22 y.o. male.   Patient with history of asthma presents to urgent care for evaluation of cough, shortness of breath, and fatigue for the last 2-3 days. States his symptoms have worsened significantly over the last several hours to the point where he is very short of breath with activity and at rest. Reports chest tightness with coughing. No reported fever, chills, recent medication changes, rash, sore throat, nausea, vomiting, diarrhea, dizziness, vision changes, headache, or body aches. No known sick contacts with similar symptoms. Non-smoker, denies drug use. No recent asthma exacerbations in the last 3 months. No recent steroid/antibiotic use.  He has been using his inhaler and nebulizer machine at home without relief of shortness of breath. Symptoms are far worse at nighttime.    Shortness of Breath   Past Medical History:  Diagnosis Date   Asthma     There are no problems to display for this patient.   History reviewed. No pertinent surgical history.     Home Medications    Prior to Admission medications   Medication Sig Start Date End Date Taking? Authorizing Provider  albuterol (VENTOLIN HFA) 108 (90 Base) MCG/ACT inhaler Inhale 1-2 puffs into the lungs every 6 (six) hours as needed for wheezing or shortness of breath. 05/06/20   Cathie HoopsYu, Amy V, PA-C  albuterol (VENTOLIN HFA) 108 (90 Base) MCG/ACT inhaler Inhale 1-2 puffs into the lungs every 6 (six) hours as needed for wheezing or shortness of breath. Patient not taking: Reported on 03/02/2023 02/27/23   Wynetta FinesMessick, Peter C, MD  azelastine (ASTELIN) 0.1 % nasal spray Place 2 sprays into both nostrils 2 (two) times daily. Patient not taking: Reported on 03/02/2023 05/06/20   Belinda FisherYu, Amy V, PA-C  benzonatate (TESSALON) 100 MG capsule Take 1  capsule (100 mg total) by mouth every 8 (eight) hours as needed for cough. Patient not taking: Reported on 03/02/2023 11/28/22   Gustavus BryantMound, Haley E, FNP  cetirizine (ZYRTEC) 10 MG tablet Take 1 tablet (10 mg total) by mouth daily. Patient not taking: Reported on 03/02/2023 11/28/22   Gustavus BryantMound, Haley E, FNP  fluticasone [facility](FLONASE) 50 MCG/ACT nasal spray Place 1 spray into both nostrils daily. Patient not taking: Reported on 03/02/2023 11/28/22   Gustavus BryantMound, Haley E, FNP  Multiple Vitamins-Minerals (MULTIVITAMIN PO) Take 1 tablet by mouth daily. Patient not taking: Reported on 03/02/2023    [provider]  ondansetron (ZOFRAN-ODT) 4 MG disintegrating tablet Take 1 tablet (4 mg total) by mouth every 8 (eight) hours as needed for nausea or vomiting. Patient not taking: Reported on 03/02/2023 11/28/22   Gustavus BryantMound, Haley E, FNP  predniSONE (DELTASONE) 10 MG tablet Take 2 tablets (20 mg total) by mouth daily. Patient not taking: Reported on 03/02/2023 02/27/23   Wynetta FinesMessick, Peter C, MD    Family History History reviewed. No pertinent family history.  Social History Social History   Tobacco Use   Smoking status: Never   Smokeless tobacco: Never  Substance Use Topics   Alcohol use: Never   Drug use: Never     Allergies   Patient has no known allergies.   Review of Systems Review of Systems  Respiratory:  Positive for shortness of breath.   Per HPI   Physical Exam Triage Vital Signs  ED Triage Vitals [02/27/23 1801]  Enc Vitals Group     BP 133/85     Pulse Rate (!) 110     Resp (!) 24     Temp 99.3 F (37.4 C)     Temp Source Oral     SpO2 (!) 89 %     Weight      Height      Head Circumference      Peak Flow      Pain Score 0     Pain Loc      Pain Edu?      Excl. in GC?    No data found.  Updated Vital Signs BP 133/85 (BP Location: Right Arm)   Pulse (!) 110   Temp 99.3 F (37.4 C) (Oral)   Resp (!) 24   SpO2 (!) 89%   Visual Acuity Right Eye Distance:   Left Eye Distance:    Bilateral Distance:    Right Eye Near:   Left Eye Near:    Bilateral Near:     Physical Exam Vitals and nursing note reviewed.  Constitutional:      General: He is in acute distress.     Appearance: He is ill-appearing. He is not toxic-appearing or diaphoretic.  HENT:     Head: Normocephalic and atraumatic.     Right Ear: Hearing, tympanic membrane, ear canal and external ear normal.     Left Ear: Hearing, tympanic membrane, ear canal and external ear normal.     Nose: Nose normal.     Mouth/Throat:     Lips: Pink.     Mouth: Mucous membranes are moist. No injury.     Tongue: No lesions. Tongue does not deviate from midline.     Palate: No mass and lesions.     Pharynx: Oropharynx is clear. Uvula midline. No pharyngeal swelling, oropharyngeal exudate, posterior oropharyngeal erythema or uvula swelling.     Tonsils: No tonsillar exudate or tonsillar abscesses.  Eyes:     General: Lids are normal. Vision grossly intact. Gaze aligned appropriately.     Extraocular Movements: Extraocular movements intact.     Conjunctiva/sclera: Conjunctivae normal.  Cardiovascular:     Rate and Rhythm: Regular rhythm. Tachycardia present.     Heart sounds: Normal heart sounds, S1 normal and S2 normal.  Pulmonary:     Effort: Tachypnea and respiratory distress present.     Breath sounds: Normal air entry. Decreased breath sounds and wheezing present. No rhonchi or rales.     Comments: On initial assessment, decreased breath sounds throughout with faint expiratory wheezes throughout. Tripoding observed with tachypnea and accessory muscle usage. Speaking in full sentences with moderate difficulty.  Chest:     Chest wall: No tenderness.  Musculoskeletal:     Cervical back: Neck supple.  Lymphadenopathy:     Cervical: No cervical adenopathy.  Skin:    General: Skin is warm and dry.     Capillary Refill: Capillary refill takes less than 2 seconds.     Findings: No rash.  Neurological:      General: No focal deficit present.     Mental Status: He is alert and oriented to person, place, and time. Mental status is at baseline.     Cranial Nerves: No dysarthria or facial asymmetry.  Psychiatric:        Mood and Affect: Mood normal.        Speech: Speech normal.        Behavior:  Behavior normal.        Thought Content: Thought content normal.        Judgment: Judgment normal.      UC Treatments / Results  Labs (all labs ordered are listed, but only abnormal results are displayed) Labs Reviewed - No data to display  EKG   Radiology DG Chest 2 View  Result Date: 02/27/2023 CLINICAL DATA:  Cough and shortness of breath EXAM: CHEST - 2 VIEW COMPARISON:  01/31/2022 FINDINGS: Chronic central bronchitic change without focal pneumonia, collapse or consolidation. Negative for edema, effusion or pneumothorax. Normal heart size and vascularity. Trachea midline. No osseous abnormality. IMPRESSION: Chronic bronchitic changes. No acute chest process. Electronically Signed   By: Judie Petit.  Shick M.D.   On: 02/27/2023 18:39    Procedures Procedures (including critical care time)  Medications Ordered in UC Medications  ipratropium-albuterol (DUONEB) 0.5-2.5 (3) MG/3ML nebulizer solution 3 mL (3 mLs Nebulization Given 02/27/23 1806)  dexamethasone (DECADRON) injection 10 mg (10 mg Intramuscular Given 02/27/23 1806)  albuterol (PROVENTIL) (2.5 MG/3ML) 0.083% nebulizer solution 2.5 mg (2.5 mg Nebulization Given 02/27/23 1835)    Initial Impression / Assessment and Plan / UC Course  I have reviewed the triage vital signs and the nursing notes.  Pertinent labs & imaging results that were available during my care of the patient were reviewed by me and considered in my medical decision making (see chart for details).   1. Moderate persistent asthma with acute exacerbation, shortness of breath, low oxygen saturation On arrival oxygen saturation 89%, tripoding observed, patient with significantly  increased work of breathing. Breath sounds tight initially with significantly decreased air movement.  Chest x-ray shows chronic bronchitic changes without acute cardiopulmonary abnormality.  Dexamethasone 10mg  IM given. Duoneb provided which slightly improved symptoms, however patient remained with decreased breath sounds and low oxygen saturation after breathing treatment around 90-91%. Albuterol breathing treatment given, oxygen saturation remained low at 89-90% after treatment, however subjective shortness of breath improved. Lung sounds after second breathing treatment improved significantly, however tachypnea and wheeze remain. Respiratory effort after second breathing treatment reduced to normal, able to speak in full sentences without difficulty.  Due to low oxygen saturation and significant increased work of breathing initially, I am concerned patient needs further monitoring and care than what we are able to provide with resources at urgent care to ensure he can safely go home and manage asthma exacerbation in the outpatient setting. He is no longer tripoding and symptoms have improved significantly, however given hypoxia, would like for patient to go to the ED via EMS. Patient declines and would rather call a family member to bring him to the ED instead. His respiratory status has improved significantly since interventions here and he is neurologically intact to baseline, therefore I am agreeable with him going to the ER with family member for further workup instead of EMS. Discussed risks of deferring ED visit, he expresses understanding and agreement with plan. Discharged from urgent care in stable condition to the ER.  Final Clinical Impressions(s) / UC Diagnoses   Final diagnoses:  Shortness of breath  Moderate persistent asthma with (acute) exacerbation  Low oxygen saturation     Discharge Instructions      Please go to the nearest emergency department ([place]) for evaluation  of your asthma further as I am concerned about your oxygen level (it is low).      ED Prescriptions   None    PDMP not reviewed this encounter.  Carlisle Beers, [place] 03/02/23 2033

## 2023-02-27 NOTE — ED Provider Notes (Signed)
[facility] AT [facility] Provider Note   CSN: [id number] Arrival date & time: 02/27/23  1953     History {Add pertinent medical, surgical, social history, OB history to HPI:1} Chief Complaint  Patient presents with   Asthma    Mitchell Wall is a 22 y.o. male.   Asthma       Home Medications Prior to Admission medications   Medication Sig Start Date End Date Taking? Authorizing Provider  albuterol (VENTOLIN HFA) 108 (90 Base) MCG/ACT inhaler Inhale 1-2 puffs into the lungs every 6 (six) hours as needed for wheezing or shortness of breath. 05/06/20   Cathie Hoops, Amy V, PA-C  azelastine (ASTELIN) 0.1 % nasal spray Place 2 sprays into both nostrils 2 (two) times daily. 05/06/20   Cathie Hoops, Amy V, PA-C  benzonatate (TESSALON) 100 MG capsule Take 1 capsule (100 mg total) by mouth every 8 (eight) hours as needed for cough. 11/28/22   Gustavus Bryant, FNP  cetirizine (ZYRTEC) 10 MG tablet Take 1 tablet (10 mg total) by mouth daily. 11/28/22   Gustavus Bryant, FNP  fluticasone (FLONASE) 50 MCG/ACT nasal spray Place 1 spray into both nostrils daily. 11/28/22   Gustavus Bryant, FNP  Multiple Vitamins-Minerals (MULTIVITAMIN PO) Take 1 tablet by mouth daily.    [provider]  ondansetron (ZOFRAN-ODT) 4 MG disintegrating tablet Take 1 tablet (4 mg total) by mouth every 8 (eight) hours as needed for nausea or vomiting. 11/28/22   Gustavus Bryant, FNP      Allergies    Patient has no known allergies.    Review of Systems   Review of Systems  Physical Exam Updated Vital Signs BP (!) 141/92   Pulse (!) 110   Temp 99 F (37.2 C) (Oral)   Resp (!) 21   Ht 5\' 9"  (1.753 m)   Wt 102.1 kg   SpO2 95%   BMI 33.23 kg/m  Physical Exam  ED Results / Procedures / Treatments   Labs (all labs ordered are listed, but only abnormal results are displayed) Labs Reviewed  RESP PANEL BY RT-PCR (RSV, FLU A&B, COVID)  RVPGX2    EKG None  Radiology DG Chest 2 View  Result  Date: 02/27/2023 CLINICAL DATA:  Cough and shortness of breath EXAM: CHEST - 2 VIEW COMPARISON:  01/31/2022 FINDINGS: Chronic central bronchitic change without focal pneumonia, collapse or consolidation. Negative for edema, effusion or pneumothorax. Normal heart size and vascularity. Trachea midline. No osseous abnormality. IMPRESSION: Chronic bronchitic changes. No acute chest process. Electronically Signed   By: Judie Petit.  Shick M.D.   On: 02/27/2023 18:39    Procedures Procedures  {Document cardiac monitor, telemetry assessment procedure when appropriate:1}  Medications Ordered in ED Medications - No data to display  ED Course/ Medical Decision Making/ A&P   {   Click here for ABCD2, HEART and other calculatorsREFRESH Note before signing :1}                          Medical Decision Making  ***  {Document critical care time when appropriate:1} {Document review of labs and clinical decision tools ie heart score, Chads2Vasc2 etc:1}  {Document your independent review of radiology images, and any outside records:1} {Document your discussion with family members, caretakers, and with consultants:1} {Document social determinants of health affecting pt's care:1} {Document your decision making why or why not admission, treatments were needed:1} Final Clinical Impression(s) / ED Diagnoses Final diagnoses:  None    Rx / DC Orders ED Discharge Orders     None

## 2023-03-02 ENCOUNTER — Ambulatory Visit: Payer: 59 | Admitting: Family Medicine

## 2023-03-02 ENCOUNTER — Encounter: Payer: Self-pay | Admitting: Family Medicine

## 2023-03-02 VITALS — BP 113/72 | HR 83 | Temp 98.1°F | Resp 16 | Ht 69.0 in | Wt 228.8 lb

## 2023-03-02 DIAGNOSIS — Z130 Encounter for screening for diseases of the blood and blood-forming organs and certain disorders involving the immune mechanism: Secondary | ICD-10-CM | POA: Diagnosis not present

## 2023-03-02 DIAGNOSIS — Z0000 Encounter for general adult medical examination without abnormal findings: Secondary | ICD-10-CM | POA: Diagnosis not present

## 2023-03-02 DIAGNOSIS — Z1159 Encounter for screening for other viral diseases: Secondary | ICD-10-CM

## 2023-03-02 DIAGNOSIS — Z114 Encounter for screening for human immunodeficiency virus [HIV]: Secondary | ICD-10-CM

## 2023-03-02 DIAGNOSIS — Z13220 Encounter for screening for lipoid disorders: Secondary | ICD-10-CM

## 2023-03-02 DIAGNOSIS — Z7689 Persons encountering health services in other specified circumstances: Secondary | ICD-10-CM

## 2023-03-07 ENCOUNTER — Encounter: Payer: Self-pay | Admitting: Family Medicine

## 2023-03-07 NOTE — Progress Notes (Signed)
New Patient Office Visit  Subjective    Patient ID: Mitchell Wall, male    DOB: 2000-12-06  Age: 22 y.o. MRN: [id number]  CC: No chief complaint on file.   HPI Mitchell Wall presents to establish care and for routine annual exam.    Outpatient Encounter Medications as of 03/02/2023  Medication Sig   albuterol (VENTOLIN HFA) 108 (90 Base) MCG/ACT inhaler Inhale 1-2 puffs into the lungs every 6 (six) hours as needed for wheezing or shortness of breath.   albuterol (VENTOLIN HFA) 108 (90 Base) MCG/ACT inhaler Inhale 1-2 puffs into the lungs every 6 (six) hours as needed for wheezing or shortness of breath. (Patient not taking: Reported on 03/02/2023)   azelastine (ASTELIN) 0.1 % nasal spray Place 2 sprays into both nostrils 2 (two) times daily. (Patient not taking: Reported on 03/02/2023)   benzonatate (TESSALON) 100 MG capsule Take 1 capsule (100 mg total) by mouth every 8 (eight) hours as needed for cough. (Patient not taking: Reported on 03/02/2023)   cetirizine (ZYRTEC) 10 MG tablet Take 1 tablet (10 mg total) by mouth daily. (Patient not taking: Reported on 03/02/2023)   fluticasone (FLONASE) 50 MCG/ACT nasal spray Place 1 spray into both nostrils daily. (Patient not taking: Reported on 03/02/2023)   Multiple Vitamins-Minerals (MULTIVITAMIN PO) Take 1 tablet by mouth daily. (Patient not taking: Reported on 03/02/2023)   ondansetron (ZOFRAN-ODT) 4 MG disintegrating tablet Take 1 tablet (4 mg total) by mouth every 8 (eight) hours as needed for nausea or vomiting. (Patient not taking: Reported on 03/02/2023)   predniSONE (DELTASONE) 10 MG tablet Take 2 tablets (20 mg total) by mouth daily. (Patient not taking: Reported on 03/02/2023)   No facility-administered encounter medications on file as of 03/02/2023.    Past Medical History:  Diagnosis Date   Asthma     History reviewed. No pertinent surgical history.  History reviewed. No pertinent family history.  Social History   Socioeconomic  History   Marital status: Single    Spouse name: Not on file   Number of children: Not on file   Years of education: Not on file   Highest education level: Not on file  Occupational History   Not on file  Tobacco Use   Smoking status: Never   Smokeless tobacco: Never  Substance and Sexual Activity   Alcohol use: Never   Drug use: Never   Sexual activity: Not on file  Other Topics Concern   Not on file  Social History Narrative   Not on file   Social Determinants of Health   Financial Resource Strain: Not on file  Food Insecurity: Not on file  Transportation Needs: Not on file  Physical Activity: Not on file  Stress: Not on file  Social Connections: Not on file  Intimate Partner Violence: Not on file    Review of Systems  All other systems reviewed and are negative.       Objective    BP 113/72   Pulse 83   Temp 98.1 F (36.7 C) (Oral)   Resp 16   Ht  (1.753 m)   Wt 228 lb 12.8 oz (103.8 kg)   SpO2 94%   BMI 33.79 kg/m   Physical Exam Vitals and nursing note reviewed.  Constitutional:      General: He is not in acute distress. HENT:     Head: Normocephalic and atraumatic.     Right Ear: Tympanic membrane, ear canal and external ear normal.  Left Ear: Tympanic membrane, ear canal and external ear normal.     Nose: Nose normal.     Mouth/Throat:     Mouth: Mucous membranes are moist.     Pharynx: Oropharynx is clear.  Eyes:     Conjunctiva/sclera: Conjunctivae normal.     Pupils: Pupils are equal, round, and reactive to light.  Neck:     Thyroid: No thyromegaly.  Cardiovascular:     Rate and Rhythm: Normal rate and regular rhythm.     Heart sounds: Normal heart sounds. No murmur heard. Pulmonary:     Effort: Pulmonary effort is normal.     Breath sounds: Normal breath sounds.  Abdominal:     General: There is no distension.     Palpations: Abdomen is soft. There is no mass.     Tenderness: There is no abdominal tenderness.     Hernia:  There is no hernia in the left inguinal area or right inguinal area.  Genitourinary:    Penis: Normal and circumcised.      Testes: Normal.  Musculoskeletal:        General: Normal range of motion.     Cervical back: Normal range of motion and neck supple.     Right lower leg: No edema.     Left lower leg: No edema.  Skin:    General: Skin is warm and dry.  Neurological:     General: No focal deficit present.     Mental Status: He is alert and oriented to person, place, and time. Mental status is at baseline.  Psychiatric:        Mood and Affect: Mood normal.        Behavior: Behavior normal.         Assessment & Plan:   1. Annual physical exam  - CMP14+EGFR  2. Need for hepatitis C screening test  - Hepatitis C Antibody  3. Screening for HIV (human immunodeficiency virus)  - HIV antibody (with reflex)  4. Screening for lipid disorders  - Lipid Panel  5. Screening for deficiency anemia  - CBC with Differential  6. Encounter to establish care     Return in about 1 year (around 03/01/2024) for physical.   Tommie Raymond, MD

## 2023-08-06 IMAGING — DX DG CHEST 2V
2 series · 2 of 2 positions shown · non-contrast
Comparison: 09/27/2004

CLINICAL DATA: Cough, sore throat, nasal drainage, headache,
diarrhea, heavy feeling in lungs

EXAM:
CHEST - 2 VIEW

[chest pa]
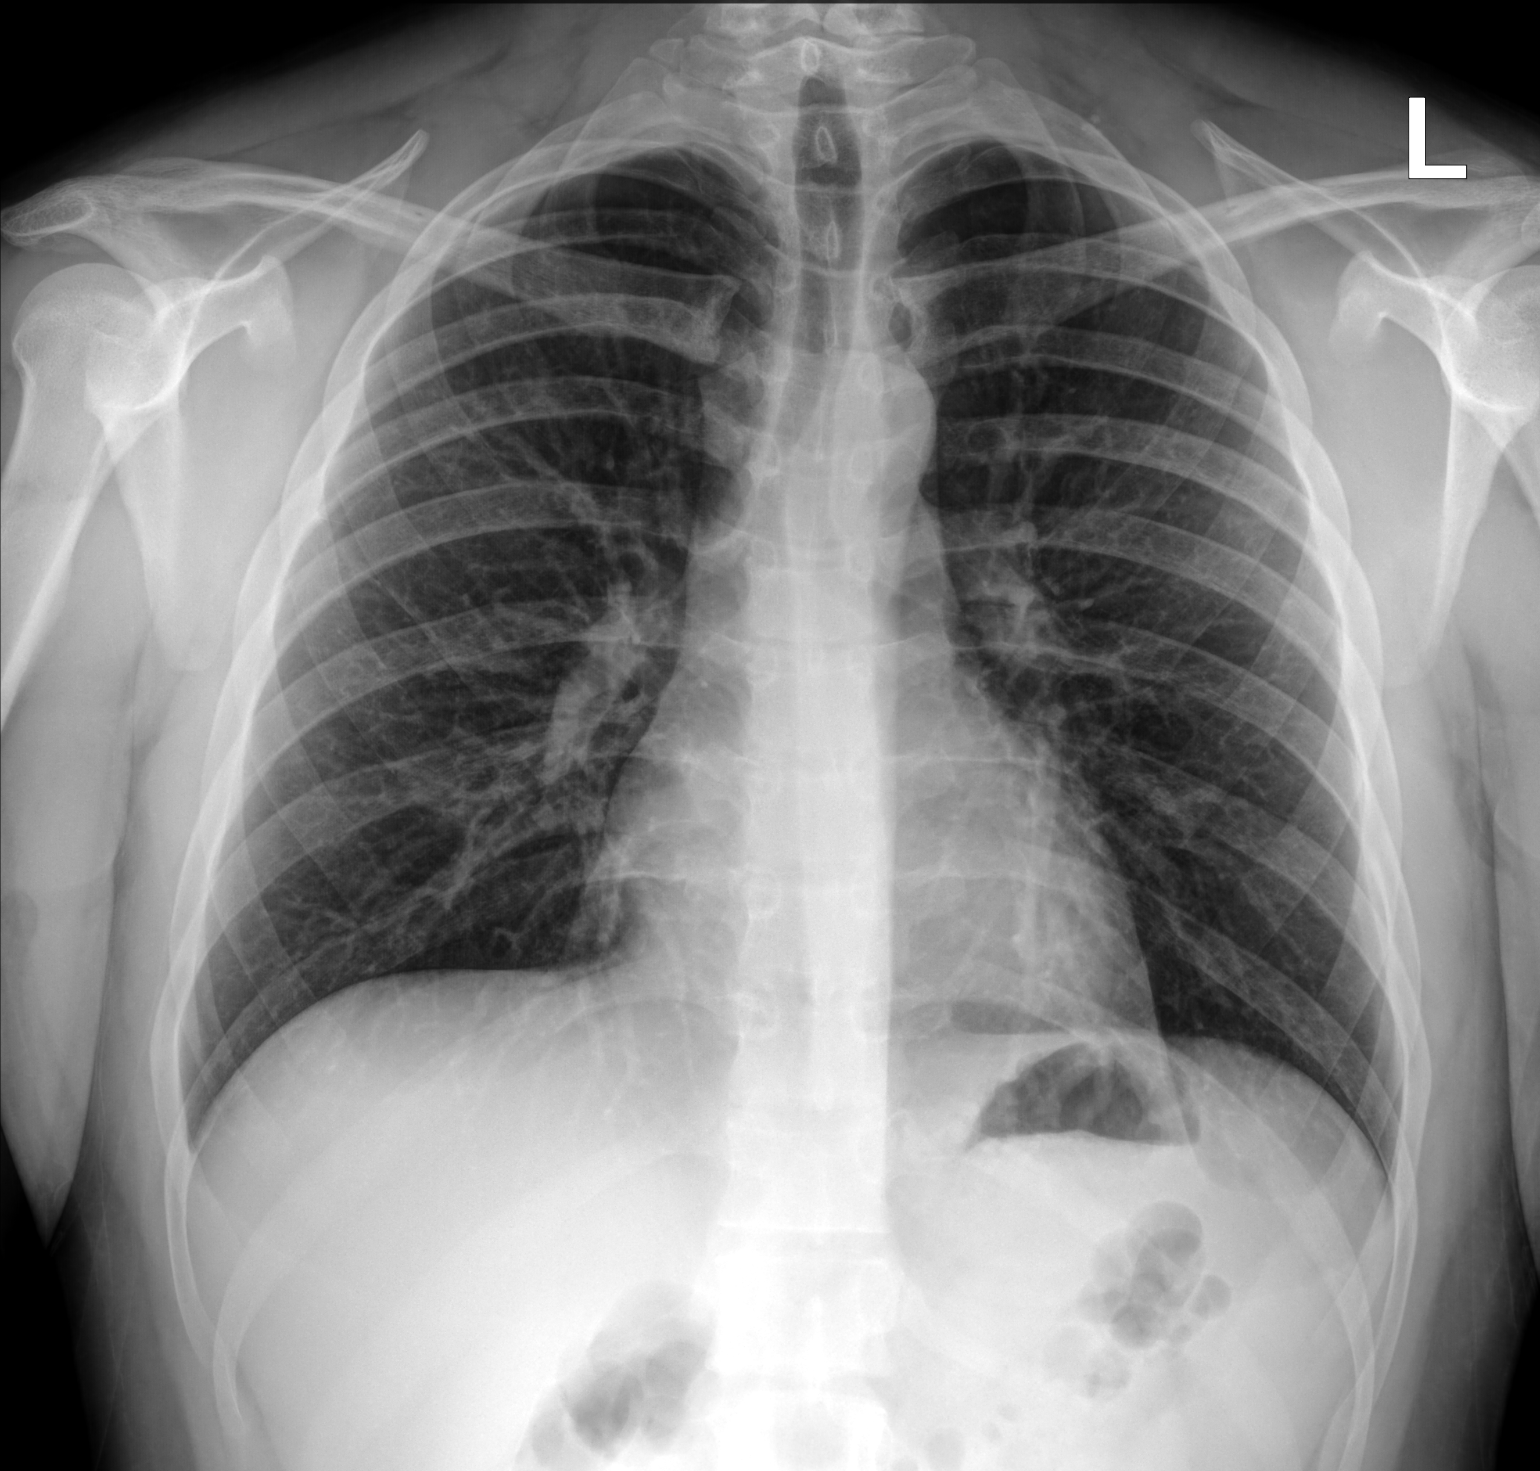

[chest lat]
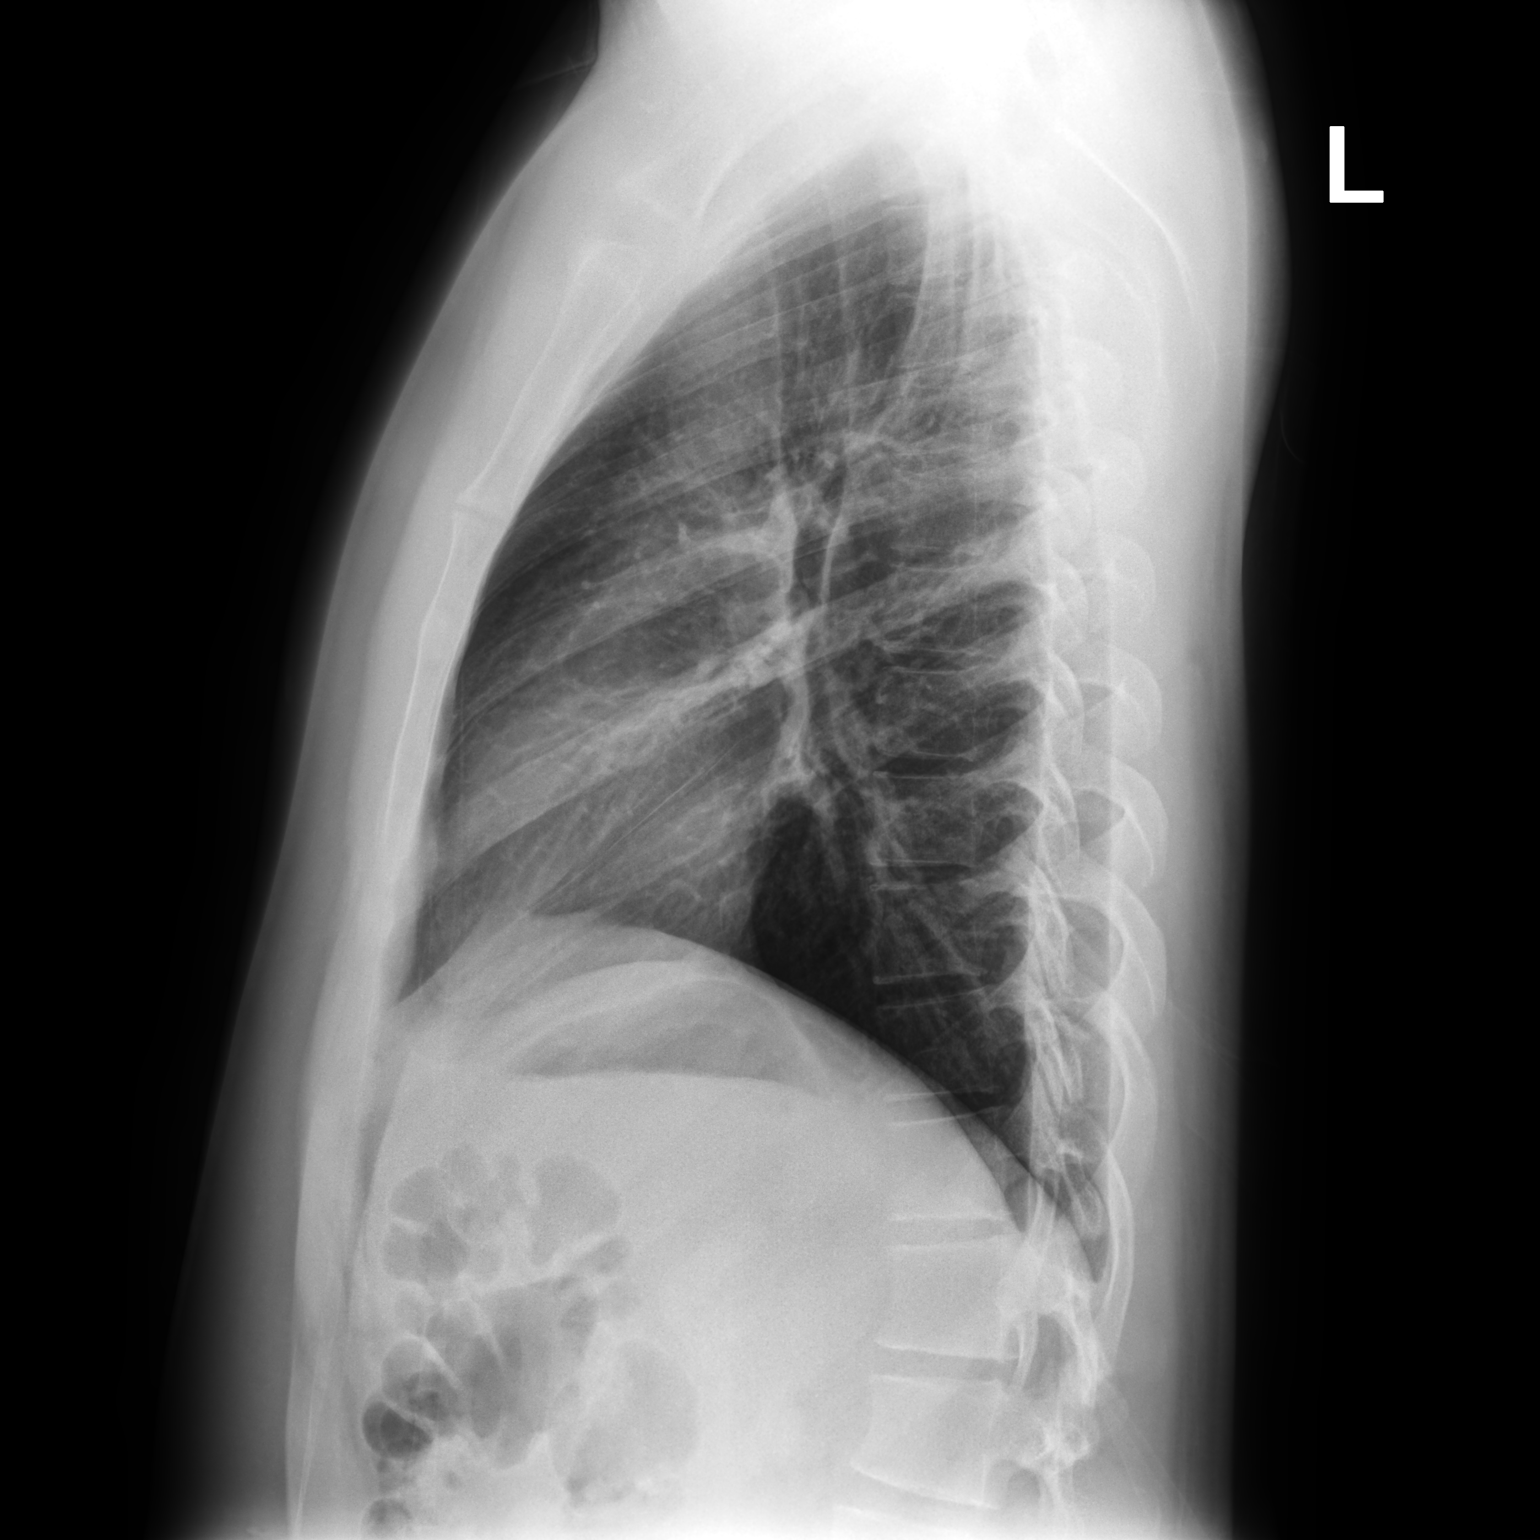

[2 of 2 positions shown; findings below may reference images not displayed]

FINDINGS: Normal heart size, mediastinal contours, and pulmonary vascularity.

Lungs clear.

No pleural effusion or pneumothorax.

Bones unremarkable.
IMPRESSION: Normal exam.

## 2023-08-24 ENCOUNTER — Ambulatory Visit
Admission: EM | Admit: 2023-08-24 | Discharge: 2023-08-24 | Disposition: A | Discharge status: Home / Self Care | Payer: Self-pay | Attending: Physician Assistant | Admitting: Physician Assistant

## 2023-08-24 DIAGNOSIS — N489 Disorder of penis, unspecified: Secondary | ICD-10-CM

## 2023-08-24 MED ORDER — KETOCONAZOLE 2 % EX CREA: 1.0000 | TOPICAL_CREAM | Freq: Every day | CUTANEOUS | 0 refills | Status: AC

## 2023-08-24 NOTE — ED Provider Notes (Signed)
[facility]    CSN: [id number] Arrival date & time: 08/24/23  1112      History   Chief Complaint Chief Complaint  Patient presents with   Rash    HPI Mitchell Wall is a 22 y.o. male.   Pt here today for evaluation of two lesions to his penis that he first noticed about a week ago.  He reports that initially he had lesion to tip of his penis and then had another develop on the shaft.  He denies any itching or pain associated with same.  He has not had fever.  He denies any other symptoms including discharge or dysuria.  He notes that he has had 1 sexual partner for the last 4 years.  The history is provided by the patient.  Rash Associated symptoms: no fever and no shortness of breath     Past Medical History:  Diagnosis Date   Asthma     There are no problems to display for this patient.   History reviewed. No pertinent surgical history.     Home Medications    Prior to Admission medications   Medication Sig Start Date End Date Taking? Authorizing Provider  ketoconazole (NIZORAL) 2 % cream Apply 1 Application topically daily. 08/24/23  Yes Tomi Bamberger, PA-C    Family History History reviewed. No pertinent family history.  Social History Social History   Tobacco Use   Smoking status: Never   Smokeless tobacco: Never  Vaping Use   Vaping status: Never Used  Substance Use Topics   Alcohol use: Never   Drug use: Never     Allergies   Patient has no known allergies.   Review of Systems Review of Systems  Constitutional:  Negative for chills and fever.  Eyes:  Negative for discharge and redness.  Respiratory:  Negative for shortness of breath.   Genitourinary:  Positive for genital sores. Negative for dysuria and penile discharge.  Neurological:  Negative for numbness.     Physical Exam Triage Vital Signs ED Triage Vitals  Encounter Vitals Group     BP 08/24/23 1138 111/72     Systolic BP Percentile --      Diastolic BP  Percentile --      Pulse Rate 08/24/23 1138 70     Resp 08/24/23 1138 16     Temp 08/24/23 1138 98.8 F (37.1 C)     Temp Source 08/24/23 1138 Oral     SpO2 08/24/23 1138 97 %     Weight 08/24/23 1136 225 lb (102.1 kg)     Height 08/24/23 1136 5\' 9"  (1.753 m)     Head Circumference --      Peak Flow --      Pain Score 08/24/23 1134 0     Pain Loc --      Pain Education --      Exclude from Growth Chart --    No data found.  Updated Vital Signs BP 111/72 (BP Location: Left Arm)   Pulse 70   Temp 98.8 F (37.1 C) (Oral)   Resp 16   Ht 5\' 9"  (1.753 m)   Wt 225 lb (102.1 kg)   SpO2 97%   BMI 33.23 kg/m   Visual Acuity Right Eye Distance:   Left Eye Distance:   Bilateral Distance:    Right Eye Near:   Left Eye Near:    Bilateral Near:     Physical Exam Vitals and nursing note reviewed.  Constitutional:      General: He is not in acute distress.    Appearance: Normal appearance. He is not ill-appearing.  HENT:     Head: Normocephalic and atraumatic.  Eyes:     Conjunctiva/sclera: Conjunctivae normal.  Cardiovascular:     Rate and Rhythm: Normal rate.  Pulmonary:     Effort: Pulmonary effort is normal. No respiratory distress.  Genitourinary:    Comments: Approx 2 cm erythematous macular lesions to tip of penis, with similar appearing slightly smaller lesion to penile shaft  Chaperone: Joss, RN Neurological:     Mental Status: He is alert.  Psychiatric:        Mood and Affect: Mood normal.        Behavior: Behavior normal.        Thought Content: Thought content normal.      UC Treatments / Results  Labs (all labs ordered are listed, but only abnormal results are displayed) Labs Reviewed  RPR    EKG   Radiology No results found.  Procedures Procedures (including critical care time)  Medications Ordered in UC Medications - No data to display  Initial Impression / Assessment and Plan / UC Course  I have reviewed the triage vital signs and  the nursing notes.  Pertinent labs & imaging results that were available during my care of the patient were reviewed by me and considered in my medical decision making (see chart for details).    Will screen for syphilis, patient declines other STD screening as he does not feel STDs cause of symptoms.  Will also treat with ketoconazole cream as there is a chance symptoms are related to fungal infection given appearance.  Encouraged follow-up if no improvement or with any worsening symptoms.  Patient expresses understanding.  Final Clinical Impressions(s) / UC Diagnoses   Final diagnoses:  Penile lesion   Discharge Instructions   None    ED Prescriptions     Medication Sig Dispense Auth. Provider   ketoconazole (NIZORAL) 2 % cream Apply 1 Application topically daily. 15 g Tomi Bamberger, PA-C      PDMP not reviewed this encounter.   Tomi Bamberger, PA-C 08/24/23 1257

## 2023-08-24 NOTE — ED Triage Notes (Signed)
"  I have a rash (red circle), one on head of penis and one on shaft, not itchy, not painful, no scrotal pain, no penis discharge, no dysuria". First noticed "last week, still there and with another".

## 2023-08-25 LAB — RPR: RPR Ser Ql: NONREACTIVE
